# Patient Record
Sex: Male | Born: 1965 | Race: White | Hispanic: No | Marital: Married | Smoking: Never smoker
Health system: Southern US, Community
[De-identification: ages and names within clinical notes are randomized; demographics above are authoritative.]

## PROBLEM LIST (undated history)

## (undated) DIAGNOSIS — E079 Disorder of thyroid, unspecified: Secondary | ICD-10-CM

## (undated) HISTORY — DX: Disorder of thyroid, unspecified: E07.9

## (undated) HISTORY — PX: ACHILLES TENDON REPAIR: SUR1153

## (undated) HISTORY — PX: APPENDECTOMY: SHX54

## (undated) HISTORY — PX: KNEE SURGERY: SHX244

---

## 2003-12-05 ENCOUNTER — Observation Stay: Payer: Self-pay | Admitting: General Surgery

## 2015-10-13 DIAGNOSIS — W458XXA Other foreign body or object entering through skin, initial encounter: Secondary | ICD-10-CM | POA: Diagnosis not present

## 2015-10-13 DIAGNOSIS — S61244A Puncture wound with foreign body of right ring finger without damage to nail, initial encounter: Secondary | ICD-10-CM | POA: Diagnosis not present

## 2015-10-13 DIAGNOSIS — Y92832 Beach as the place of occurrence of the external cause: Secondary | ICD-10-CM | POA: Diagnosis not present

## 2015-10-13 DIAGNOSIS — Z23 Encounter for immunization: Secondary | ICD-10-CM | POA: Diagnosis not present

## 2015-10-29 DIAGNOSIS — M11262 Other chondrocalcinosis, left knee: Secondary | ICD-10-CM | POA: Diagnosis not present

## 2015-10-29 DIAGNOSIS — M1712 Unilateral primary osteoarthritis, left knee: Secondary | ICD-10-CM | POA: Diagnosis not present

## 2015-10-29 DIAGNOSIS — M25462 Effusion, left knee: Secondary | ICD-10-CM | POA: Diagnosis not present

## 2016-04-04 DIAGNOSIS — M1712 Unilateral primary osteoarthritis, left knee: Secondary | ICD-10-CM | POA: Diagnosis not present

## 2016-05-02 DIAGNOSIS — M9903 Segmental and somatic dysfunction of lumbar region: Secondary | ICD-10-CM | POA: Diagnosis not present

## 2016-05-02 DIAGNOSIS — M5431 Sciatica, right side: Secondary | ICD-10-CM | POA: Diagnosis not present

## 2016-05-02 DIAGNOSIS — M9905 Segmental and somatic dysfunction of pelvic region: Secondary | ICD-10-CM | POA: Diagnosis not present

## 2016-05-02 DIAGNOSIS — M5136 Other intervertebral disc degeneration, lumbar region: Secondary | ICD-10-CM | POA: Diagnosis not present

## 2016-05-03 DIAGNOSIS — M5136 Other intervertebral disc degeneration, lumbar region: Secondary | ICD-10-CM | POA: Diagnosis not present

## 2016-05-03 DIAGNOSIS — M5431 Sciatica, right side: Secondary | ICD-10-CM | POA: Diagnosis not present

## 2016-05-03 DIAGNOSIS — M9905 Segmental and somatic dysfunction of pelvic region: Secondary | ICD-10-CM | POA: Diagnosis not present

## 2016-05-03 DIAGNOSIS — M9903 Segmental and somatic dysfunction of lumbar region: Secondary | ICD-10-CM | POA: Diagnosis not present

## 2016-05-16 DIAGNOSIS — M5431 Sciatica, right side: Secondary | ICD-10-CM | POA: Diagnosis not present

## 2016-05-16 DIAGNOSIS — M5136 Other intervertebral disc degeneration, lumbar region: Secondary | ICD-10-CM | POA: Diagnosis not present

## 2016-05-16 DIAGNOSIS — M9903 Segmental and somatic dysfunction of lumbar region: Secondary | ICD-10-CM | POA: Diagnosis not present

## 2016-05-16 DIAGNOSIS — M9905 Segmental and somatic dysfunction of pelvic region: Secondary | ICD-10-CM | POA: Diagnosis not present

## 2016-05-18 DIAGNOSIS — M9905 Segmental and somatic dysfunction of pelvic region: Secondary | ICD-10-CM | POA: Diagnosis not present

## 2016-05-18 DIAGNOSIS — M5431 Sciatica, right side: Secondary | ICD-10-CM | POA: Diagnosis not present

## 2016-05-18 DIAGNOSIS — M9903 Segmental and somatic dysfunction of lumbar region: Secondary | ICD-10-CM | POA: Diagnosis not present

## 2016-05-18 DIAGNOSIS — M5136 Other intervertebral disc degeneration, lumbar region: Secondary | ICD-10-CM | POA: Diagnosis not present

## 2016-05-20 DIAGNOSIS — M9905 Segmental and somatic dysfunction of pelvic region: Secondary | ICD-10-CM | POA: Diagnosis not present

## 2016-05-20 DIAGNOSIS — M5431 Sciatica, right side: Secondary | ICD-10-CM | POA: Diagnosis not present

## 2016-05-20 DIAGNOSIS — M9903 Segmental and somatic dysfunction of lumbar region: Secondary | ICD-10-CM | POA: Diagnosis not present

## 2016-05-20 DIAGNOSIS — M5136 Other intervertebral disc degeneration, lumbar region: Secondary | ICD-10-CM | POA: Diagnosis not present

## 2016-05-23 DIAGNOSIS — M9903 Segmental and somatic dysfunction of lumbar region: Secondary | ICD-10-CM | POA: Diagnosis not present

## 2016-05-23 DIAGNOSIS — M5136 Other intervertebral disc degeneration, lumbar region: Secondary | ICD-10-CM | POA: Diagnosis not present

## 2016-05-23 DIAGNOSIS — M9905 Segmental and somatic dysfunction of pelvic region: Secondary | ICD-10-CM | POA: Diagnosis not present

## 2016-05-23 DIAGNOSIS — M5431 Sciatica, right side: Secondary | ICD-10-CM | POA: Diagnosis not present

## 2016-05-25 DIAGNOSIS — M5431 Sciatica, right side: Secondary | ICD-10-CM | POA: Diagnosis not present

## 2016-05-25 DIAGNOSIS — M5136 Other intervertebral disc degeneration, lumbar region: Secondary | ICD-10-CM | POA: Diagnosis not present

## 2016-05-25 DIAGNOSIS — M9903 Segmental and somatic dysfunction of lumbar region: Secondary | ICD-10-CM | POA: Diagnosis not present

## 2016-05-25 DIAGNOSIS — M9905 Segmental and somatic dysfunction of pelvic region: Secondary | ICD-10-CM | POA: Diagnosis not present

## 2016-05-30 DIAGNOSIS — M9903 Segmental and somatic dysfunction of lumbar region: Secondary | ICD-10-CM | POA: Diagnosis not present

## 2016-05-30 DIAGNOSIS — M9905 Segmental and somatic dysfunction of pelvic region: Secondary | ICD-10-CM | POA: Diagnosis not present

## 2016-05-30 DIAGNOSIS — M5431 Sciatica, right side: Secondary | ICD-10-CM | POA: Diagnosis not present

## 2016-05-30 DIAGNOSIS — M5136 Other intervertebral disc degeneration, lumbar region: Secondary | ICD-10-CM | POA: Diagnosis not present

## 2016-06-01 DIAGNOSIS — M9905 Segmental and somatic dysfunction of pelvic region: Secondary | ICD-10-CM | POA: Diagnosis not present

## 2016-06-01 DIAGNOSIS — M5136 Other intervertebral disc degeneration, lumbar region: Secondary | ICD-10-CM | POA: Diagnosis not present

## 2016-06-01 DIAGNOSIS — M9903 Segmental and somatic dysfunction of lumbar region: Secondary | ICD-10-CM | POA: Diagnosis not present

## 2016-06-01 DIAGNOSIS — M5431 Sciatica, right side: Secondary | ICD-10-CM | POA: Diagnosis not present

## 2016-07-18 DIAGNOSIS — M9905 Segmental and somatic dysfunction of pelvic region: Secondary | ICD-10-CM | POA: Diagnosis not present

## 2016-07-18 DIAGNOSIS — M9903 Segmental and somatic dysfunction of lumbar region: Secondary | ICD-10-CM | POA: Diagnosis not present

## 2016-07-18 DIAGNOSIS — M5431 Sciatica, right side: Secondary | ICD-10-CM | POA: Diagnosis not present

## 2016-07-18 DIAGNOSIS — M5136 Other intervertebral disc degeneration, lumbar region: Secondary | ICD-10-CM | POA: Diagnosis not present

## 2016-11-28 DIAGNOSIS — M1712 Unilateral primary osteoarthritis, left knee: Secondary | ICD-10-CM | POA: Diagnosis not present

## 2016-12-19 DIAGNOSIS — M25562 Pain in left knee: Secondary | ICD-10-CM | POA: Diagnosis not present

## 2016-12-19 DIAGNOSIS — M11262 Other chondrocalcinosis, left knee: Secondary | ICD-10-CM | POA: Diagnosis not present

## 2016-12-19 DIAGNOSIS — M170 Bilateral primary osteoarthritis of knee: Secondary | ICD-10-CM | POA: Diagnosis not present

## 2017-05-01 DIAGNOSIS — M1712 Unilateral primary osteoarthritis, left knee: Secondary | ICD-10-CM | POA: Diagnosis not present

## 2017-05-08 DIAGNOSIS — M1712 Unilateral primary osteoarthritis, left knee: Secondary | ICD-10-CM | POA: Diagnosis not present

## 2017-05-15 DIAGNOSIS — M1712 Unilateral primary osteoarthritis, left knee: Secondary | ICD-10-CM | POA: Diagnosis not present

## 2017-08-30 ENCOUNTER — Encounter: Payer: Self-pay | Admitting: Adult Health

## 2017-08-30 ENCOUNTER — Ambulatory Visit: Payer: BLUE CROSS/BLUE SHIELD | Admitting: Adult Health

## 2017-08-30 VITALS — BP 142/82 | HR 62 | Resp 16 | Ht 75.0 in | Wt 280.2 lb

## 2017-08-30 DIAGNOSIS — N529 Male erectile dysfunction, unspecified: Secondary | ICD-10-CM | POA: Diagnosis not present

## 2017-08-30 DIAGNOSIS — Z0000 Encounter for general adult medical examination without abnormal findings: Secondary | ICD-10-CM | POA: Diagnosis not present

## 2017-08-30 DIAGNOSIS — I10 Essential (primary) hypertension: Secondary | ICD-10-CM | POA: Diagnosis not present

## 2017-08-30 NOTE — Progress Notes (Signed)
[facility] [address] [place], [place] [address]  Internal MEDICINE  Office Visit Note  Patient Name: Howard Hahn  [id number]  [id number]  Date of Service: 09/04/2017  Chief Complaint  Patient presents with  . Establish Care    New patient needing paperwork filled out for insurance company     HPI Pt is here for routine health maintenance examination.  He denies chronic problems.  He has been watching is diet, and reports a 33lb weight loss over two months. He reports good health, and has no significant complaints.  He is concerned with some erectile dysfunction he is having.  He is having trouble maintaining erection during intercourse. He would like his testosterone level checked.   Current Medication: Outpatient Encounter Medications as of 08/30/2017  Medication Sig  . sildenafil (VIAGRA) 100 MG tablet Take 0.5-1 tablets (50-100 mg total) by mouth daily as needed for erectile dysfunction.   No facility-administered encounter medications on file as of 08/30/2017.     Surgical History: Past Surgical History:  Procedure Laterality Date  . ACHILLES TENDON REPAIR    . APPENDECTOMY    . KNEE SURGERY      Medical History: History reviewed. No pertinent past medical history.  Family History: Family History  Problem Relation Age of Onset  . Lung cancer Mother   . Prostate cancer Father    Review of Systems  Constitutional: Negative.  Negative for chills, fatigue and unexpected weight change.  HENT: Negative.  Negative for congestion.   Eyes: Negative for redness.  Respiratory: Negative.  Negative for chest tightness.   Cardiovascular: Negative.  Negative for palpitations.  Gastrointestinal: Negative.  Negative for abdominal pain, constipation, diarrhea, nausea and vomiting.  Endocrine: Negative.   Genitourinary: Negative.  Negative for dysuria and frequency.  Musculoskeletal: Negative.  Negative for arthralgias, back pain, joint swelling and neck  pain.  Skin: Negative.  Negative for rash.  Allergic/Immunologic: Negative.   Neurological: Negative.  Negative for tremors and numbness.  Hematological: Negative for adenopathy. Does not bruise/bleed easily.  Psychiatric/Behavioral: Negative.  Negative for behavioral problems, sleep disturbance and suicidal ideas. The patient is not nervous/anxious.    Vital Signs: BP (!) 142/82   Pulse 62   Resp 16   Ht 6\' 3"  (1.905 m)   Wt 280 lb 3.2 oz (127.1 kg)   SpO2 95%   BMI 35.02 kg/m    Physical Exam  Constitutional: He is oriented to person, place, and time. He appears well-developed and well-nourished. No distress.  HENT:  Head: Normocephalic and atraumatic.  Mouth/Throat: Oropharynx is clear and moist. No oropharyngeal exudate.  Eyes: Pupils are equal, round, and reactive to light. EOM are normal.  Neck: Normal range of motion. Neck supple. No JVD present. No tracheal deviation present. No thyromegaly present.  Cardiovascular: Normal rate, regular rhythm and normal heart sounds. Exam reveals no gallop and no friction rub.  No murmur heard. Pulmonary/Chest: Effort normal and breath sounds normal. No respiratory distress. He has no wheezes. He has no rales. He exhibits no tenderness.  Abdominal: Soft. There is no hepatosplenomegaly. There is no tenderness. There is no guarding. No hernia. Hernia confirmed negative in the right inguinal area and confirmed negative in the left inguinal area.  Genitourinary: Rectum normal, testes normal and penis normal. Rectal exam shows anal tone normal. Prostate is not enlarged and not tender. Cremasteric reflex is present. Right testis shows no mass, no swelling and no tenderness. Left testis shows no mass, no  swelling and no tenderness. Circumcised. No phimosis, hypospadias, penile erythema or penile tenderness. No discharge found.  Musculoskeletal: Normal range of motion.  Lymphadenopathy:    He has no cervical adenopathy. No inguinal adenopathy noted  on the right or left side.  Neurological: He is alert and oriented to person, place, and time. No cranial nerve deficit.  Skin: Skin is warm and dry. He is not diaphoretic.  Psychiatric: He has a normal mood and affect. His behavior is normal. Judgment and thought content normal.  Nursing note and vitals reviewed.  Assessment/Plan: 1. Routine general medical examination at a health care facility Yearly health screening to establish care.  - CBC with Differential/Platelet - Lipid Panel With LDL/HDL Ratio - TSH - T4, free - Comprehensive metabolic panel - Testosterone,Free and Total - PSA - Hemoglobin A1c  2. Erectile dysfunction, unspecified erectile dysfunction type Check Testosterone level.  - Testosterone,Free and Total  General Counseling: Howard LevinsJoseph verbalizes understanding of the findings of todays visit and agrees with plan of treatment. I have discussed any further diagnostic evaluation that may be needed or ordered today. We also reviewed his medications today. he has been encouraged to call the office with any questions or concerns that should arise related to todays visit.   Orders Placed This Encounter  Procedures  . CBC with Differential/Platelet  . Lipid Panel With LDL/HDL Ratio  . TSH  . T4, free  . Comprehensive metabolic panel  . Testosterone,Free and Total  . PSA  . Hemoglobin A1c  . T4  . T3  . Specimen status report    Meds ordered this encounter  Medications  . sildenafil (VIAGRA) 100 MG tablet    Sig: Take 0.5-1 tablets (50-100 mg total) by mouth daily as needed for erectile dysfunction.    Dispense:  5 tablet    Refill:  11    Time spent: 30 Minutes  This patient was seen by Blima LedgerAdam Dysen Edmondson AGNP-C in Collaboration with Dr Lyndon CodeFozia M Khan as a part of collaborative care agreement   Lyndon CodeFozia M Khan, MD  Internal Medicine

## 2017-08-30 NOTE — Progress Notes (Signed)
Pt presents with health establishment paperwork that needs to be filled out for [employer]. Patient was on vacation when they provided care at work.

## 2017-08-31 ENCOUNTER — Ambulatory Visit: Payer: BLUE CROSS/BLUE SHIELD

## 2017-08-31 ENCOUNTER — Telehealth: Payer: Self-pay | Admitting: Adult Health

## 2017-08-31 DIAGNOSIS — E1165 Type 2 diabetes mellitus with hyperglycemia: Secondary | ICD-10-CM

## 2017-08-31 DIAGNOSIS — Z0001 Encounter for general adult medical examination with abnormal findings: Secondary | ICD-10-CM

## 2017-08-31 LAB — POCT GLYCOSYLATED HEMOGLOBIN (HGB A1C): Hemoglobin A1C: 5.6 % (ref 4.0–5.6)

## 2017-08-31 LAB — PSA: Prostate Specific Ag, Serum: 1.7 ng/mL (ref 0.0–4.0)

## 2017-08-31 NOTE — Telephone Encounter (Signed)
Spoke to [place] at [facility] added T3/T4 to test

## 2017-09-01 LAB — LIPID PANEL WITH LDL/HDL RATIO
CHOLESTEROL TOTAL: 179 mg/dL (ref 100–199)
HDL: 43 mg/dL (ref >39)
LDL Calculated: 116 mg/dL — ABNORMAL HIGH (ref 0–99)
LDl/HDL Ratio: 2.7 ratio (ref 0.0–3.6)
Triglycerides: 100 mg/dL (ref 0–149)
VLDL CHOLESTEROL CAL: 20 mg/dL (ref 5–40)

## 2017-09-01 LAB — CBC WITH DIFFERENTIAL/PLATELET
BASOS ABS: 0.1 10*3/uL (ref 0.0–0.2)
BASOS: 1 %
EOS (ABSOLUTE): 0.2 10*3/uL (ref 0.0–0.4)
Eos: 4 %
Hematocrit: 44.6 % (ref 37.5–51.0)
Hemoglobin: 14.7 g/dL (ref 13.0–17.7)
IMMATURE GRANS (ABS): 0 10*3/uL (ref 0.0–0.1)
IMMATURE GRANULOCYTES: 0 %
LYMPHS: 19 %
Lymphocytes Absolute: 1.3 10*3/uL (ref 0.7–3.1)
MCH: 29.4 pg (ref 26.6–33.0)
MCHC: 33 g/dL (ref 31.5–35.7)
MCV: 89 fL (ref 79–97)
Monocytes Absolute: 0.6 10*3/uL (ref 0.1–0.9)
Monocytes: 9 %
NEUTROS PCT: 67 %
Neutrophils Absolute: 4.5 10*3/uL (ref 1.4–7.0)
PLATELETS: 258 10*3/uL (ref 150–450)
RBC: 5 x10E6/uL (ref 4.14–5.80)
RDW: 12.9 % (ref 12.3–15.4)
WBC: 6.7 10*3/uL (ref 3.4–10.8)

## 2017-09-01 LAB — COMPREHENSIVE METABOLIC PANEL
ALBUMIN: 4.4 g/dL (ref 3.5–5.5)
ALT: 31 IU/L (ref 0–44)
AST: 24 IU/L (ref 0–40)
Albumin/Globulin Ratio: 1.8 (ref 1.2–2.2)
Alkaline Phosphatase: 86 IU/L (ref 39–117)
BUN/Creatinine Ratio: 15 (ref 9–20)
BUN: 12 mg/dL (ref 6–24)
Bilirubin Total: 0.3 mg/dL (ref 0.0–1.2)
CALCIUM: 9.2 mg/dL (ref 8.7–10.2)
CO2: 21 mmol/L (ref 20–29)
Chloride: 104 mmol/L (ref 96–106)
Creatinine, Ser: 0.78 mg/dL (ref 0.76–1.27)
GFR, EST AFRICAN AMERICAN: 120 mL/min/{1.73_m2} (ref >59)
GFR, EST NON AFRICAN AMERICAN: 104 mL/min/{1.73_m2} (ref >59)
GLOBULIN, TOTAL: 2.4 g/dL (ref 1.5–4.5)
Glucose: 91 mg/dL (ref 65–99)
Potassium: 4.3 mmol/L (ref 3.5–5.2)
SODIUM: 141 mmol/L (ref 134–144)
TOTAL PROTEIN: 6.8 g/dL (ref 6.0–8.5)

## 2017-09-01 LAB — TESTOSTERONE,FREE AND TOTAL
TESTOSTERONE FREE: 8.7 pg/mL (ref 7.2–24.0)
TESTOSTERONE: 394 ng/dL (ref 264–916)

## 2017-09-01 LAB — SPECIMEN STATUS REPORT

## 2017-09-01 LAB — TSH: TSH: 5.71 u[IU]/mL — ABNORMAL HIGH (ref 0.450–4.500)

## 2017-09-01 LAB — T4, FREE: Free T4: 1.03 ng/dL (ref 0.82–1.77)

## 2017-09-01 LAB — T3: T3, Total: 111 ng/dL (ref 71–180)

## 2017-09-01 LAB — T4: T4 TOTAL: 6.4 ug/dL (ref 4.5–12.0)

## 2017-09-04 MED ORDER — LEVOTHYROXINE SODIUM 50 MCG PO TABS
ORAL_TABLET | ORAL | 1 refills | Status: DC
Start: 2017-09-04 — End: 2017-10-30

## 2017-09-04 MED ORDER — SILDENAFIL CITRATE 100 MG PO TABS: 50.0000 mg | ORAL_TABLET | Freq: Every day | ORAL | 11 refills | Status: DC | PRN

## 2017-09-05 ENCOUNTER — Other Ambulatory Visit: Payer: Self-pay | Admitting: Internal Medicine

## 2017-09-05 DIAGNOSIS — E039 Hypothyroidism, unspecified: Secondary | ICD-10-CM

## 2017-09-14 ENCOUNTER — Telehealth: Payer: Self-pay | Admitting: Adult Health

## 2017-10-30 ENCOUNTER — Other Ambulatory Visit: Payer: Self-pay

## 2017-10-30 MED ORDER — LEVOTHYROXINE SODIUM 50 MCG PO TABS: ORAL_TABLET | ORAL | 1 refills | Status: DC

## 2017-11-03 ENCOUNTER — Ambulatory Visit: Payer: Self-pay | Admitting: Adult Health

## 2017-11-03 ENCOUNTER — Other Ambulatory Visit: Payer: Self-pay

## 2017-11-15 ENCOUNTER — Ambulatory Visit: Payer: Self-pay | Admitting: Adult Health

## 2017-11-20 ENCOUNTER — Other Ambulatory Visit: Payer: Self-pay | Admitting: Adult Health

## 2017-11-20 DIAGNOSIS — E039 Hypothyroidism, unspecified: Secondary | ICD-10-CM | POA: Diagnosis not present

## 2017-11-21 LAB — THYROGLOBULIN ANTIBODY

## 2017-11-21 LAB — T4, FREE: FREE T4: 1.13 ng/dL (ref 0.82–1.77)

## 2017-11-21 LAB — TSH: TSH: 3.58 u[IU]/mL (ref 0.450–4.500)

## 2017-11-21 LAB — THYROID PEROXIDASE ANTIBODY: Thyroperoxidase Ab SerPl-aCnc: 10 IU/mL (ref 0–34)

## 2017-12-06 ENCOUNTER — Ambulatory Visit: Payer: BLUE CROSS/BLUE SHIELD | Admitting: Adult Health

## 2017-12-06 ENCOUNTER — Encounter: Payer: Self-pay | Admitting: Adult Health

## 2017-12-06 VITALS — BP 118/72 | HR 64 | Resp 16 | Ht 75.0 in | Wt 267.0 lb

## 2017-12-06 DIAGNOSIS — E039 Hypothyroidism, unspecified: Secondary | ICD-10-CM | POA: Diagnosis not present

## 2017-12-06 DIAGNOSIS — I10 Essential (primary) hypertension: Secondary | ICD-10-CM

## 2017-12-06 MED ORDER — LEVOTHYROXINE SODIUM 50 MCG PO TABS: ORAL_TABLET | ORAL | 2 refills | Status: DC

## 2017-12-06 NOTE — Progress Notes (Signed)
[facility] [address] [place], [place] [address]  Internal MEDICINE  Office Visit Note  Patient Name: Howard Hahn  [id number]  [id number]  Date of Service: 12/06/2017  Chief Complaint  Patient presents with  . Hypothyroidism    3 month follow up labs     HPI  Patient here for 39-month follow-up after being started on Synthroid 50 mcg for hypothyroid found to physical labs.  His thyroid levels are much improved after these 2 months.  He reports feeling great.  He reports he has lost a total of 40 pounds since he started diet and exercise.  He is lost 13 pounds since I first saw him in July of this year.  He and his wife both have started a clean diet program.  They do have concern that they feel like they can see his thyroid now that he has lost weight in his neck.  Other than this they deny complaints.   Current Medication: Outpatient Encounter Medications as of 12/06/2017  Medication Sig  . levothyroxine (SYNTHROID, LEVOTHROID) 50 MCG tablet Take one tab in am on empty stomach, wait 30 min before eating  . sildenafil (VIAGRA) 100 MG tablet Take 0.5-1 tablets (50-100 mg total) by mouth daily as needed for erectile dysfunction.  . [DISCONTINUED] levothyroxine (SYNTHROID, LEVOTHROID) 50 MCG tablet Take one tab in am on empty stomach, wait 30 min before eating   No facility-administered encounter medications on file as of 12/06/2017.     Surgical History: Past Surgical History:  Procedure Laterality Date  . ACHILLES TENDON REPAIR    . APPENDECTOMY    . KNEE SURGERY      Medical History: Past Medical History:  Diagnosis Date  . Thyroid disease     Family History: Family History  Problem Relation Age of Onset  . Lung cancer Mother   . Prostate cancer Father     Social History   Socioeconomic History  . Marital status: Married    Spouse name: Not on file  . Number of children: Not on file  . Years of education: Not on file  . Highest education  level: Not on file  Occupational History  . Not on file  Social Needs  . Financial resource strain: Not on file  . Food insecurity:    Worry: Not on file    Inability: Not on file  . Transportation needs:    Medical: Not on file    Non-medical: Not on file  Tobacco Use  . Smoking status: Never Smoker  . Smokeless tobacco: Never Used  Substance and Sexual Activity  . Alcohol use: Never    Frequency: Never  . Drug use: Never  . Sexual activity: Not on file  Lifestyle  . Physical activity:    Days per week: Not on file    Minutes per session: Not on file  . Stress: Not on file  Relationships  . Social connections:    Talks on phone: Not on file    Gets together: Not on file    Attends religious service: Not on file    Active member of club or organization: Not on file    Attends meetings of clubs or organizations: Not on file    Relationship status: Not on file  . Intimate partner violence:    Fear of current or ex partner: Not on file    Emotionally abused: Not on file    Physically abused: Not on file    Forced  sexual activity: Not on file  Other Topics Concern  . Not on file  Social History Narrative  . Not on file      Review of Systems  Constitutional: Negative.  Negative for chills, fatigue and unexpected weight change.  HENT: Negative.  Negative for congestion, rhinorrhea, sneezing and sore throat.   Eyes: Negative for redness.  Respiratory: Negative.  Negative for cough, chest tightness and shortness of breath.   Cardiovascular: Negative.  Negative for chest pain and palpitations.  Gastrointestinal: Negative.  Negative for abdominal pain, constipation, diarrhea, nausea and vomiting.  Endocrine: Negative.   Genitourinary: Negative.  Negative for dysuria and frequency.  Musculoskeletal: Negative.  Negative for arthralgias, back pain, joint swelling and neck pain.  Skin: Negative.  Negative for rash.  Allergic/Immunologic: Negative.   Neurological: Negative.   Negative for tremors and numbness.  Hematological: Negative for adenopathy. Does not bruise/bleed easily.  Psychiatric/Behavioral: Negative.  Negative for behavioral problems, sleep disturbance and suicidal ideas. The patient is not nervous/anxious.     Vital Signs: BP 118/72   Pulse 64   Resp 16   Ht 6\' 3"  (1.905 m)   Wt 267 lb (121.1 kg)   SpO2 98%   BMI 33.37 kg/m    Physical Exam  Constitutional: He is oriented to person, place, and time. He appears well-developed and well-nourished. No distress.  HENT:  Head: Normocephalic and atraumatic.  Mouth/Throat: Oropharynx is clear and moist. No oropharyngeal exudate.  Eyes: Pupils are equal, round, and reactive to light. EOM are normal.  Neck: Normal range of motion. Neck supple. No JVD present. No tracheal deviation present. Thyromegaly present.  Pt has prominent Adams apple that is more visible now with weight loss.  However, slight enlargement of thyroid area is noted.    Cardiovascular: Normal rate, regular rhythm and normal heart sounds. Exam reveals no gallop and no friction rub.  No murmur heard. Pulmonary/Chest: Effort normal and breath sounds normal. No respiratory distress. He has no wheezes. He has no rales. He exhibits no tenderness.  Abdominal: Soft. There is no tenderness. There is no guarding.  Musculoskeletal: Normal range of motion.  Lymphadenopathy:    He has no cervical adenopathy.  Neurological: He is alert and oriented to person, place, and time. No cranial nerve deficit.  Skin: Skin is warm and dry. He is not diaphoretic.  Psychiatric: He has a normal mood and affect. His behavior is normal. Judgment and thought content normal.  Nursing note and vitals reviewed.  Assessment/Plan: 1. Hypothyroidism, unspecified type Due to this possible enlargement of thyroid, thyroid ultrasound ordered.  We will follow-up with patient when results are available. - US THYROID; Future  2. Elevated blood pressure reading in  office with diagnosis of hypertension In the past patient's blood pressure was elevated during visit.  He is well controlled today most likely due to thyroid control and continued weight loss.  He denies any events of chest pain, headache, palpitations or other symptoms of high blood pressure.  General Counseling: forrester blando understanding of the findings of todays visit and agrees with plan of treatment. I have discussed any further diagnostic evaluation that may be needed or ordered today. We also reviewed his medications today. he has been encouraged to call the office with any questions or concerns that should arise related to todays visit.    Orders Placed This Encounter  Procedures  . US THYROID    Meds ordered this encounter  Medications  . levothyroxine (SYNTHROID, LEVOTHROID) 50  MCG tablet    Sig: Take one tab in am on empty stomach, wait 30 min before eating    Dispense:  90 tablet    Refill:  2    Time spent: 25 Minutes   This patient was seen by Blima Ledger AGNP-C in Collaboration with Dr Lyndon Code as a part of collaborative care agreement     Johnna Acosta AGNP-C Internal medicine

## 2017-12-06 NOTE — Patient Instructions (Signed)
Hypothyroidism Hypothyroidism is a disorder of the thyroid. The thyroid is a large gland that is located in the lower front of the neck. The thyroid releases hormones that control how the body works. With hypothyroidism, the thyroid does not make enough of these hormones. What are the causes? Causes of hypothyroidism may include:  Viral infections.  Pregnancy.  Your own defense system (immune system) attacking your thyroid.  Certain medicines.  Birth defects.  Past radiation treatments to your head or neck.  Past treatment with radioactive iodine.  Past surgical removal of part or all of your thyroid.  Problems with the gland that is located in the center of your brain (pituitary).  What are the signs or symptoms? Signs and symptoms of hypothyroidism may include:  Feeling as though you have no energy (lethargy).  Inability to tolerate cold.  Weight gain that is not explained by a change in diet or exercise habits.  Dry skin.  Coarse hair.  Menstrual irregularity.  Slowing of thought processes.  Constipation.  Sadness or depression.  How is this diagnosed? Your health care provider may diagnose hypothyroidism with blood tests and ultrasound tests. How is this treated? Hypothyroidism is treated with medicine that replaces the hormones that your body does not make. After you begin treatment, it may take several weeks for symptoms to go away. Follow these instructions at home:  Take medicines only as directed by your health care provider.  If you start taking any new medicines, tell your health care provider.  Keep all follow-up visits as directed by your health care provider. This is important. As your condition improves, your dosage needs may change. You will need to have blood tests regularly so that your health care provider can watch your condition. Contact a health care provider if:  Your symptoms do not get better with treatment.  You are taking thyroid  replacement medicine and: ? You sweat excessively. ? You have tremors. ? You feel anxious. ? You lose weight rapidly. ? You cannot tolerate heat. ? You have emotional swings. ? You have diarrhea. ? You feel weak. Get help right away if:  You develop chest pain.  You develop an irregular heartbeat.  You develop a rapid heartbeat. This information is not intended to replace advice given to you by your health care provider. Make sure you discuss any questions you have with your health care provider. Document Released: 01/31/2005 Document Revised: 07/09/2015 Document Reviewed: 06/18/2013 Elsevier Interactive Patient Education  2018 Elsevier Inc.  

## 2017-12-13 ENCOUNTER — Telehealth: Payer: Self-pay | Admitting: Adult Health

## 2017-12-13 ENCOUNTER — Ambulatory Visit
Admission: RE | Admit: 2017-12-13 | Discharge: 2017-12-13 | Disposition: A | Discharge status: Home / Self Care | Payer: BLUE CROSS/BLUE SHIELD | Source: Ambulatory Visit | Attending: Adult Health | Admitting: Adult Health

## 2017-12-13 DIAGNOSIS — E039 Hypothyroidism, unspecified: Secondary | ICD-10-CM | POA: Insufficient documentation

## 2017-12-13 NOTE — Telephone Encounter (Signed)
Patient advised on thyroid US was normal

## 2018-01-17 ENCOUNTER — Ambulatory Visit: Payer: Self-pay | Admitting: Adult Health

## 2018-03-14 DIAGNOSIS — R0683 Snoring: Secondary | ICD-10-CM | POA: Diagnosis not present

## 2018-03-14 DIAGNOSIS — H93299 Other abnormal auditory perceptions, unspecified ear: Secondary | ICD-10-CM | POA: Diagnosis not present

## 2018-03-14 DIAGNOSIS — H93293 Other abnormal auditory perceptions, bilateral: Secondary | ICD-10-CM | POA: Diagnosis not present

## 2018-03-28 DIAGNOSIS — L249 Irritant contact dermatitis, unspecified cause: Secondary | ICD-10-CM | POA: Diagnosis not present

## 2018-03-28 DIAGNOSIS — L570 Actinic keratosis: Secondary | ICD-10-CM | POA: Diagnosis not present

## 2018-03-28 DIAGNOSIS — L218 Other seborrheic dermatitis: Secondary | ICD-10-CM | POA: Diagnosis not present

## 2018-03-28 DIAGNOSIS — Z1283 Encounter for screening for malignant neoplasm of skin: Secondary | ICD-10-CM | POA: Diagnosis not present

## 2018-04-27 ENCOUNTER — Telehealth: Payer: Self-pay

## 2018-04-27 ENCOUNTER — Other Ambulatory Visit: Payer: Self-pay

## 2018-04-27 ENCOUNTER — Other Ambulatory Visit: Payer: Self-pay | Admitting: Adult Health

## 2018-04-27 DIAGNOSIS — R7989 Other specified abnormal findings of blood chemistry: Secondary | ICD-10-CM

## 2018-04-27 DIAGNOSIS — E039 Hypothyroidism, unspecified: Secondary | ICD-10-CM

## 2018-04-27 NOTE — Telephone Encounter (Signed)
Pt advised put order need appt to review labs

## 2018-04-27 NOTE — Telephone Encounter (Signed)
Called patient and left message asked him to call back and schedule follow up after labs are drawn. Beth

## 2018-04-27 NOTE — Progress Notes (Signed)
Ordered patients TSH and Free T4 level. Will have him schedule an appt and return.

## 2018-04-27 NOTE — Telephone Encounter (Signed)
Howard Hahn, I ordered Free T4 and TSH,  Beth, please call patient and schedule and appt.  And please tell him to get his labs done at least two days before that appt.  Thank you.

## 2018-06-25 ENCOUNTER — Other Ambulatory Visit: Payer: Self-pay | Admitting: Adult Health

## 2018-06-25 ENCOUNTER — Other Ambulatory Visit: Payer: Self-pay

## 2018-06-25 MED ORDER — SCOPOLAMINE 1 MG/3DAYS TD PT72: 1.0000 | MEDICATED_PATCH | TRANSDERMAL | 1 refills | Status: AC | PRN

## 2018-06-25 NOTE — Progress Notes (Signed)
Scopolamine patches sent to [facility] for patients upcoming [place] trip.

## 2018-10-10 DIAGNOSIS — M5431 Sciatica, right side: Secondary | ICD-10-CM | POA: Diagnosis not present

## 2018-10-10 DIAGNOSIS — M5136 Other intervertebral disc degeneration, lumbar region: Secondary | ICD-10-CM | POA: Diagnosis not present

## 2018-10-10 DIAGNOSIS — M9903 Segmental and somatic dysfunction of lumbar region: Secondary | ICD-10-CM | POA: Diagnosis not present

## 2018-10-10 DIAGNOSIS — M9905 Segmental and somatic dysfunction of pelvic region: Secondary | ICD-10-CM | POA: Diagnosis not present

## 2018-10-17 DIAGNOSIS — M9903 Segmental and somatic dysfunction of lumbar region: Secondary | ICD-10-CM | POA: Diagnosis not present

## 2018-10-17 DIAGNOSIS — M9905 Segmental and somatic dysfunction of pelvic region: Secondary | ICD-10-CM | POA: Diagnosis not present

## 2018-10-17 DIAGNOSIS — M5431 Sciatica, right side: Secondary | ICD-10-CM | POA: Diagnosis not present

## 2018-10-17 DIAGNOSIS — M5136 Other intervertebral disc degeneration, lumbar region: Secondary | ICD-10-CM | POA: Diagnosis not present

## 2018-10-31 DIAGNOSIS — M5431 Sciatica, right side: Secondary | ICD-10-CM | POA: Diagnosis not present

## 2018-10-31 DIAGNOSIS — M5136 Other intervertebral disc degeneration, lumbar region: Secondary | ICD-10-CM | POA: Diagnosis not present

## 2018-10-31 DIAGNOSIS — M9903 Segmental and somatic dysfunction of lumbar region: Secondary | ICD-10-CM | POA: Diagnosis not present

## 2018-10-31 DIAGNOSIS — M9905 Segmental and somatic dysfunction of pelvic region: Secondary | ICD-10-CM | POA: Diagnosis not present

## 2018-11-07 DIAGNOSIS — M5431 Sciatica, right side: Secondary | ICD-10-CM | POA: Diagnosis not present

## 2018-11-07 DIAGNOSIS — M5136 Other intervertebral disc degeneration, lumbar region: Secondary | ICD-10-CM | POA: Diagnosis not present

## 2018-11-07 DIAGNOSIS — M9903 Segmental and somatic dysfunction of lumbar region: Secondary | ICD-10-CM | POA: Diagnosis not present

## 2018-11-07 DIAGNOSIS — M9905 Segmental and somatic dysfunction of pelvic region: Secondary | ICD-10-CM | POA: Diagnosis not present

## 2018-11-11 NOTE — Telephone Encounter (Signed)
NA

## 2018-11-28 DIAGNOSIS — M9905 Segmental and somatic dysfunction of pelvic region: Secondary | ICD-10-CM | POA: Diagnosis not present

## 2018-11-28 DIAGNOSIS — M9903 Segmental and somatic dysfunction of lumbar region: Secondary | ICD-10-CM | POA: Diagnosis not present

## 2018-11-28 DIAGNOSIS — M5431 Sciatica, right side: Secondary | ICD-10-CM | POA: Diagnosis not present

## 2018-11-28 DIAGNOSIS — M5136 Other intervertebral disc degeneration, lumbar region: Secondary | ICD-10-CM | POA: Diagnosis not present

## 2018-12-05 ENCOUNTER — Ambulatory Visit: Payer: BC Managed Care – PPO | Admitting: Adult Health

## 2018-12-05 ENCOUNTER — Encounter: Payer: Self-pay | Admitting: Adult Health

## 2018-12-05 ENCOUNTER — Other Ambulatory Visit: Payer: Self-pay

## 2018-12-05 VITALS — BP 147/89 | HR 75 | Temp 97.5°F | Resp 16 | Ht 75.0 in | Wt 287.0 lb

## 2018-12-05 DIAGNOSIS — E039 Hypothyroidism, unspecified: Secondary | ICD-10-CM | POA: Diagnosis not present

## 2018-12-05 DIAGNOSIS — M1812 Unilateral primary osteoarthritis of first carpometacarpal joint, left hand: Secondary | ICD-10-CM

## 2018-12-05 MED ORDER — MELOXICAM 15 MG PO TABS: 15.0000 mg | ORAL_TABLET | Freq: Every day | ORAL | 1 refills | Status: DC | PRN

## 2018-12-05 NOTE — Progress Notes (Signed)
[facility] [address] [place], [place] [address]  Internal MEDICINE  Office Visit Note  Patient Name: Howard Hahn  [id number]  [id number]  Date of Service: 12/05/2018  Chief Complaint  Patient presents with  . Hypothyroidism    HPI Patient is here for thyroid follow-up. Orders were sent for TSH and free T4 in March, patient unable to get labs drawn due to COVID. Reports no issues with fatigue, weight gain or mood swings. Has been off levothyroxine for several months. Reports wanting to see lab values before restarting medication. Complains of pain and swelling on left thumb due to osteoarthritis. Has been taking over the counter ibuprofen with relief but wakes up in mornings with pain and stiffness due to swelling.    Current Medication: Outpatient Encounter Medications as of 12/05/2018  Medication Sig  . levothyroxine (SYNTHROID, LEVOTHROID) 50 MCG tablet Take one tab in am on empty stomach, wait 30 min before eating (Patient not taking: Reported on 12/05/2018)  . sildenafil (VIAGRA) 100 MG tablet Take 0.5-1 tablets (50-100 mg total) by mouth daily as needed for erectile dysfunction. (Patient not taking: Reported on 12/05/2018)   No facility-administered encounter medications on file as of 12/05/2018.     Surgical History: Past Surgical History:  Procedure Laterality Date  . ACHILLES TENDON REPAIR    . APPENDECTOMY    . KNEE SURGERY      Medical History: Past Medical History:  Diagnosis Date  . Thyroid disease     Family History: Family History  Problem Relation Age of Onset  . Lung cancer Mother   . Prostate cancer Father     Social History   Socioeconomic History  . Marital status: Married    Spouse name: Not on file  . Number of children: Not on file  . Years of education: Not on file  . Highest education level: Not on file  Occupational History  . Not on file  Social Needs  . Financial resource strain: Not on file  . Food  insecurity    Worry: Not on file    Inability: Not on file  . Transportation needs    Medical: Not on file    Non-medical: Not on file  Tobacco Use  . Smoking status: Never Smoker  . Smokeless tobacco: Never Used  Substance and Sexual Activity  . Alcohol use: Never    Frequency: Never  . Drug use: Never  . Sexual activity: Not on file  Lifestyle  . Physical activity    Days per week: Not on file    Minutes per session: Not on file  . Stress: Not on file  Relationships  . Social Musician on phone: Not on file    Gets together: Not on file    Attends religious service: Not on file    Active member of club or organization: Not on file    Attends meetings of clubs or organizations: Not on file    Relationship status: Not on file  . Intimate partner violence    Fear of current or ex partner: Not on file    Emotionally abused: Not on file    Physically abused: Not on file    Forced sexual activity: Not on file  Other Topics Concern  . Not on file  Social History Narrative  . Not on file      Review of Systems  Constitutional: Negative.  Negative for chills, fatigue and unexpected weight change.  HENT:  Negative.  Negative for congestion, rhinorrhea, sneezing and sore throat.   Eyes: Negative for redness.  Respiratory: Negative.  Negative for cough, chest tightness and shortness of breath.   Cardiovascular: Negative.  Negative for chest pain and palpitations.  Gastrointestinal: Negative.  Negative for abdominal pain, constipation, diarrhea, nausea and vomiting.  Endocrine: Negative.   Genitourinary: Negative.  Negative for dysuria and frequency.  Musculoskeletal: Negative.  Negative for arthralgias, back pain, joint swelling and neck pain.  Skin: Negative.  Negative for rash.  Allergic/Immunologic: Negative.   Neurological: Negative.  Negative for tremors and numbness.  Hematological: Negative for adenopathy. Does not bruise/bleed easily.   Psychiatric/Behavioral: Negative.  Negative for behavioral problems, sleep disturbance and suicidal ideas. The patient is not nervous/anxious.       Vital Signs: BP (!) 147/89   Pulse 75   Temp (!) 97.5 F (36.4 C)   Resp 16   Ht 6\' 3"  (1.905 m)   Wt 287 lb (130.2 kg)   SpO2 95%   BMI 35.87 kg/m    Physical Exam Vitals signs and nursing note reviewed.  Constitutional:      General: He is not in acute distress.    Appearance: He is well-developed. He is not diaphoretic.  HENT:     Head: Normocephalic and atraumatic.     Mouth/Throat:     Pharynx: No oropharyngeal exudate.  Eyes:     Pupils: Pupils are equal, round, and reactive to light.  Neck:     Musculoskeletal: Normal range of motion and neck supple.     Thyroid: No thyromegaly.     Vascular: No JVD.     Trachea: No tracheal deviation.  Cardiovascular:     Rate and Rhythm: Normal rate and regular rhythm.     Heart sounds: Normal heart sounds. No murmur. No friction rub. No gallop.   Pulmonary:     Effort: Pulmonary effort is normal. No respiratory distress.     Breath sounds: Normal breath sounds. No wheezing or rales.  Chest:     Chest wall: No tenderness.  Abdominal:     Palpations: Abdomen is soft.     Tenderness: There is no abdominal tenderness. There is no guarding.  Musculoskeletal: Normal range of motion.  Lymphadenopathy:     Cervical: No cervical adenopathy.  Skin:    General: Skin is warm and dry.  Neurological:     Mental Status: He is alert and oriented to person, place, and time.     Cranial Nerves: No cranial nerve deficit.  Psychiatric:        Behavior: Behavior normal.        Thought Content: Thought content normal.        Judgment: Judgment normal.    Assessment/Plan: 1. Hypothyroidism, unspecified type Patient to get labs drawn that were ordered in March. Will follow-up with lab values with patient and determine plan of care based on them.  2. 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 Also instructed to try placing ice on affected area for relief of symptoms. - meloxicam (MOBIC) 15 MG tablet; Take 1 tablet (15 mg total) by mouth daily as needed for pain.  Dispense: 30 tablet; Refill: 1   General Counseling: Elizbeth Squires understanding of the findings of todays visit and agrees with plan of treatment. I have discussed any further diagnostic evaluation that may  be needed or ordered today. We also reviewed his medications today. he has been encouraged to call the office with any questions or concerns that should arise related to todays visit.    No orders of the defined types were placed in this encounter.   No orders of the defined types were placed in this encounter.   Time spent: 25 Minutes   This patient was seen by Blima LedgerAdam Oz Gammel AGNP-C in Collaboration with Dr Lyndon CodeFozia M Khan as a part of collaborative care agreement     Johnna AcostaAdam J. Sylvain Hasten AGNP-C Internal medicine

## 2018-12-26 DIAGNOSIS — M9905 Segmental and somatic dysfunction of pelvic region: Secondary | ICD-10-CM | POA: Diagnosis not present

## 2018-12-26 DIAGNOSIS — M5431 Sciatica, right side: Secondary | ICD-10-CM | POA: Diagnosis not present

## 2018-12-26 DIAGNOSIS — M5136 Other intervertebral disc degeneration, lumbar region: Secondary | ICD-10-CM | POA: Diagnosis not present

## 2018-12-26 DIAGNOSIS — M9903 Segmental and somatic dysfunction of lumbar region: Secondary | ICD-10-CM | POA: Diagnosis not present

## 2019-01-23 ENCOUNTER — Other Ambulatory Visit: Payer: Self-pay

## 2019-01-23 DIAGNOSIS — M9905 Segmental and somatic dysfunction of pelvic region: Secondary | ICD-10-CM | POA: Diagnosis not present

## 2019-01-23 DIAGNOSIS — R5381 Other malaise: Secondary | ICD-10-CM

## 2019-01-23 DIAGNOSIS — M5431 Sciatica, right side: Secondary | ICD-10-CM | POA: Diagnosis not present

## 2019-01-23 DIAGNOSIS — M5136 Other intervertebral disc degeneration, lumbar region: Secondary | ICD-10-CM | POA: Diagnosis not present

## 2019-01-23 DIAGNOSIS — E079 Disorder of thyroid, unspecified: Secondary | ICD-10-CM

## 2019-01-23 DIAGNOSIS — M9903 Segmental and somatic dysfunction of lumbar region: Secondary | ICD-10-CM | POA: Diagnosis not present

## 2019-01-23 DIAGNOSIS — R5382 Chronic fatigue, unspecified: Secondary | ICD-10-CM

## 2019-01-23 DIAGNOSIS — R5383 Other fatigue: Secondary | ICD-10-CM

## 2019-01-23 DIAGNOSIS — E756 Lipid storage disorder, unspecified: Secondary | ICD-10-CM

## 2019-01-28 DIAGNOSIS — R5382 Chronic fatigue, unspecified: Secondary | ICD-10-CM | POA: Diagnosis not present

## 2019-01-28 DIAGNOSIS — R5381 Other malaise: Secondary | ICD-10-CM | POA: Diagnosis not present

## 2019-01-28 DIAGNOSIS — R5383 Other fatigue: Secondary | ICD-10-CM | POA: Diagnosis not present

## 2019-01-28 DIAGNOSIS — E756 Lipid storage disorder, unspecified: Secondary | ICD-10-CM | POA: Diagnosis not present

## 2019-01-28 DIAGNOSIS — E079 Disorder of thyroid, unspecified: Secondary | ICD-10-CM | POA: Diagnosis not present

## 2019-01-28 LAB — CBC WITH DIFFERENTIAL/PLATELET
Eos: 3 %
Hemoglobin: 15.3 g/dL (ref 13.0–17.7)
Immature Grans (Abs): 0 10*3/uL (ref 0.0–0.1)
MCH: 29.9 pg (ref 26.6–33.0)
MCHC: 34.5 g/dL (ref 31.5–35.7)
MCV: 87 fL (ref 79–97)
Monocytes Absolute: 0.6 10*3/uL (ref 0.1–0.9)
Monocytes: 8 %
Neutrophils: 68 %

## 2019-01-28 LAB — COMPREHENSIVE METABOLIC PANEL

## 2019-01-28 LAB — LIPID PANEL WITH LDL/HDL RATIO

## 2019-01-29 LAB — CBC WITH DIFFERENTIAL/PLATELET
Basophils Absolute: 0.1 10*3/uL (ref 0.0–0.2)
Basos: 1 %
EOS (ABSOLUTE): 0.2 10*3/uL (ref 0.0–0.4)
Hematocrit: 44.3 % (ref 37.5–51.0)
Immature Granulocytes: 0 %
Lymphocytes Absolute: 1.4 10*3/uL (ref 0.7–3.1)
Lymphs: 20 %
Neutrophils Absolute: 5 10*3/uL (ref 1.4–7.0)
Platelets: 251 10*3/uL (ref 150–450)
RBC: 5.11 x10E6/uL (ref 4.14–5.80)
RDW: 12.5 % (ref 11.6–15.4)
WBC: 7.3 10*3/uL (ref 3.4–10.8)

## 2019-01-29 LAB — TSH+FREE T4
Free T4: 1.05 ng/dL (ref 0.82–1.77)
TSH: 5.79 u[IU]/mL — ABNORMAL HIGH (ref 0.450–4.500)

## 2019-01-29 LAB — COMPREHENSIVE METABOLIC PANEL
ALT: 36 IU/L (ref 0–44)
Albumin/Globulin Ratio: 1.7 (ref 1.2–2.2)
Albumin: 4.4 g/dL (ref 3.8–4.9)
Alkaline Phosphatase: 98 IU/L (ref 39–117)
BUN/Creatinine Ratio: 13 (ref 9–20)
BUN: 12 mg/dL (ref 6–24)
Bilirubin Total: 0.3 mg/dL (ref 0.0–1.2)
CO2: 21 mmol/L (ref 20–29)
Calcium: 9.5 mg/dL (ref 8.7–10.2)
Chloride: 103 mmol/L (ref 96–106)
Creatinine, Ser: 0.96 mg/dL (ref 0.76–1.27)
Globulin, Total: 2.6 g/dL (ref 1.5–4.5)
Sodium: 138 mmol/L (ref 134–144)

## 2019-01-29 LAB — LIPID PANEL WITH LDL/HDL RATIO
HDL: 43 mg/dL (ref >39)
LDL Chol Calc (NIH): 140 mg/dL — ABNORMAL HIGH (ref 0–99)
LDL/HDL Ratio: 3.3 ratio (ref 0.0–3.6)
Triglycerides: 207 mg/dL — ABNORMAL HIGH (ref 0–149)

## 2019-02-12 ENCOUNTER — Telehealth: Payer: Self-pay

## 2019-02-13 NOTE — Telephone Encounter (Signed)
He can hold off on his Thyroid medication for now, will recheck his levels in 3- 6 months.

## 2019-02-14 ENCOUNTER — Telehealth: Payer: Self-pay

## 2019-02-14 NOTE — Telephone Encounter (Signed)
Pt advised hold for thyroid med for now and we can recheck in 3 months

## 2019-02-20 DIAGNOSIS — M9903 Segmental and somatic dysfunction of lumbar region: Secondary | ICD-10-CM | POA: Diagnosis not present

## 2019-02-20 DIAGNOSIS — M5431 Sciatica, right side: Secondary | ICD-10-CM | POA: Diagnosis not present

## 2019-02-20 DIAGNOSIS — M9905 Segmental and somatic dysfunction of pelvic region: Secondary | ICD-10-CM | POA: Diagnosis not present

## 2019-02-20 DIAGNOSIS — M5136 Other intervertebral disc degeneration, lumbar region: Secondary | ICD-10-CM | POA: Diagnosis not present

## 2019-03-20 DIAGNOSIS — M5136 Other intervertebral disc degeneration, lumbar region: Secondary | ICD-10-CM | POA: Diagnosis not present

## 2019-03-20 DIAGNOSIS — M5431 Sciatica, right side: Secondary | ICD-10-CM | POA: Diagnosis not present

## 2019-03-20 DIAGNOSIS — M9905 Segmental and somatic dysfunction of pelvic region: Secondary | ICD-10-CM | POA: Diagnosis not present

## 2019-03-20 DIAGNOSIS — M9903 Segmental and somatic dysfunction of lumbar region: Secondary | ICD-10-CM | POA: Diagnosis not present

## 2019-04-17 DIAGNOSIS — M5136 Other intervertebral disc degeneration, lumbar region: Secondary | ICD-10-CM | POA: Diagnosis not present

## 2019-04-17 DIAGNOSIS — M9905 Segmental and somatic dysfunction of pelvic region: Secondary | ICD-10-CM | POA: Diagnosis not present

## 2019-04-17 DIAGNOSIS — M9903 Segmental and somatic dysfunction of lumbar region: Secondary | ICD-10-CM | POA: Diagnosis not present

## 2019-04-17 DIAGNOSIS — M5431 Sciatica, right side: Secondary | ICD-10-CM | POA: Diagnosis not present

## 2019-05-15 DIAGNOSIS — M5431 Sciatica, right side: Secondary | ICD-10-CM | POA: Diagnosis not present

## 2019-05-15 DIAGNOSIS — M9903 Segmental and somatic dysfunction of lumbar region: Secondary | ICD-10-CM | POA: Diagnosis not present

## 2019-05-15 DIAGNOSIS — M5136 Other intervertebral disc degeneration, lumbar region: Secondary | ICD-10-CM | POA: Diagnosis not present

## 2019-05-15 DIAGNOSIS — M9905 Segmental and somatic dysfunction of pelvic region: Secondary | ICD-10-CM | POA: Diagnosis not present

## 2019-06-12 DIAGNOSIS — M9905 Segmental and somatic dysfunction of pelvic region: Secondary | ICD-10-CM | POA: Diagnosis not present

## 2019-06-12 DIAGNOSIS — M9903 Segmental and somatic dysfunction of lumbar region: Secondary | ICD-10-CM | POA: Diagnosis not present

## 2019-06-12 DIAGNOSIS — M5431 Sciatica, right side: Secondary | ICD-10-CM | POA: Diagnosis not present

## 2019-06-12 DIAGNOSIS — M5136 Other intervertebral disc degeneration, lumbar region: Secondary | ICD-10-CM | POA: Diagnosis not present

## 2019-07-17 DIAGNOSIS — M9905 Segmental and somatic dysfunction of pelvic region: Secondary | ICD-10-CM | POA: Diagnosis not present

## 2019-07-17 DIAGNOSIS — M5136 Other intervertebral disc degeneration, lumbar region: Secondary | ICD-10-CM | POA: Diagnosis not present

## 2019-07-17 DIAGNOSIS — M9903 Segmental and somatic dysfunction of lumbar region: Secondary | ICD-10-CM | POA: Diagnosis not present

## 2019-07-17 DIAGNOSIS — M5431 Sciatica, right side: Secondary | ICD-10-CM | POA: Diagnosis not present

## 2019-08-28 DIAGNOSIS — M9903 Segmental and somatic dysfunction of lumbar region: Secondary | ICD-10-CM | POA: Diagnosis not present

## 2019-08-28 DIAGNOSIS — M9905 Segmental and somatic dysfunction of pelvic region: Secondary | ICD-10-CM | POA: Diagnosis not present

## 2019-08-28 DIAGNOSIS — M5136 Other intervertebral disc degeneration, lumbar region: Secondary | ICD-10-CM | POA: Diagnosis not present

## 2019-08-28 DIAGNOSIS — M5431 Sciatica, right side: Secondary | ICD-10-CM | POA: Diagnosis not present

## 2019-09-25 DIAGNOSIS — M5136 Other intervertebral disc degeneration, lumbar region: Secondary | ICD-10-CM | POA: Diagnosis not present

## 2019-09-25 DIAGNOSIS — M5431 Sciatica, right side: Secondary | ICD-10-CM | POA: Diagnosis not present

## 2019-09-25 DIAGNOSIS — M9905 Segmental and somatic dysfunction of pelvic region: Secondary | ICD-10-CM | POA: Diagnosis not present

## 2019-09-25 DIAGNOSIS — M9903 Segmental and somatic dysfunction of lumbar region: Secondary | ICD-10-CM | POA: Diagnosis not present

## 2019-11-06 DIAGNOSIS — M5431 Sciatica, right side: Secondary | ICD-10-CM | POA: Diagnosis not present

## 2019-11-06 DIAGNOSIS — M5136 Other intervertebral disc degeneration, lumbar region: Secondary | ICD-10-CM | POA: Diagnosis not present

## 2019-11-06 DIAGNOSIS — M9905 Segmental and somatic dysfunction of pelvic region: Secondary | ICD-10-CM | POA: Diagnosis not present

## 2019-11-06 DIAGNOSIS — M9903 Segmental and somatic dysfunction of lumbar region: Secondary | ICD-10-CM | POA: Diagnosis not present

## 2020-01-29 DIAGNOSIS — M5136 Other intervertebral disc degeneration, lumbar region: Secondary | ICD-10-CM | POA: Diagnosis not present

## 2020-01-29 DIAGNOSIS — M5431 Sciatica, right side: Secondary | ICD-10-CM | POA: Diagnosis not present

## 2020-01-29 DIAGNOSIS — M9905 Segmental and somatic dysfunction of pelvic region: Secondary | ICD-10-CM | POA: Diagnosis not present

## 2020-01-29 DIAGNOSIS — M9903 Segmental and somatic dysfunction of lumbar region: Secondary | ICD-10-CM | POA: Diagnosis not present

## 2020-02-17 IMAGING — US US THYROID
1 series · 14 of 25 positions shown · non-contrast
Comparison: None.

CLINICAL DATA: Hypothyroidism

EXAM:
THYROID ULTRASOUND
TECHNIQUE: Ultrasound examination of the thyroid gland and adjacent soft
tissues was performed.

[Series 1: us thyroid · 0.07mm/px · 14 of 39 slices shown]
[im 1/39]
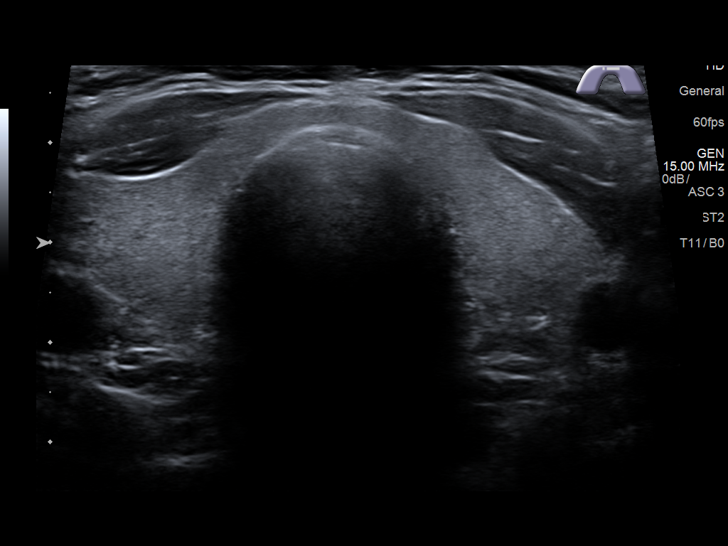
[im 4/39]
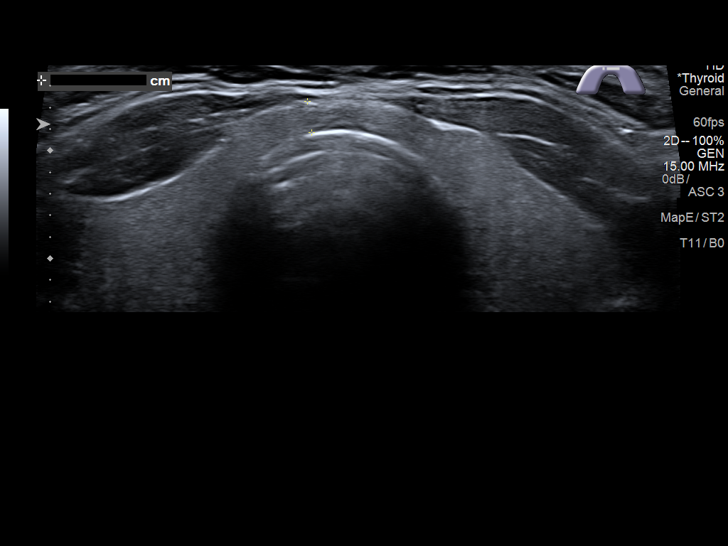
[im 7/39]
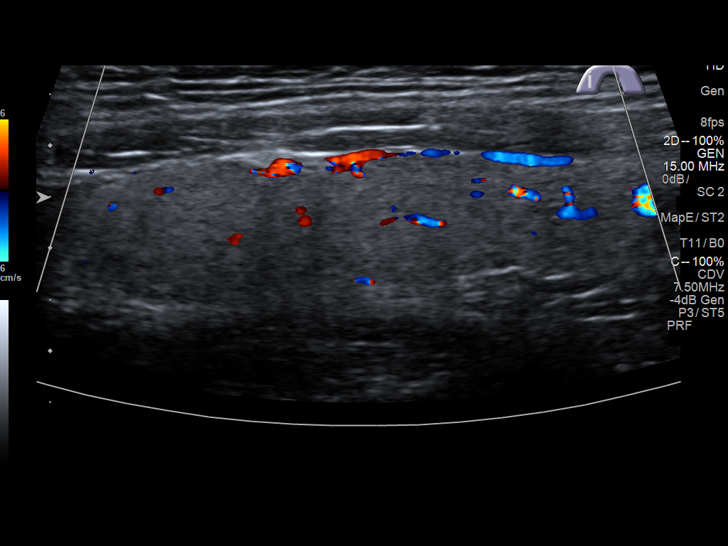
[im 10/39]
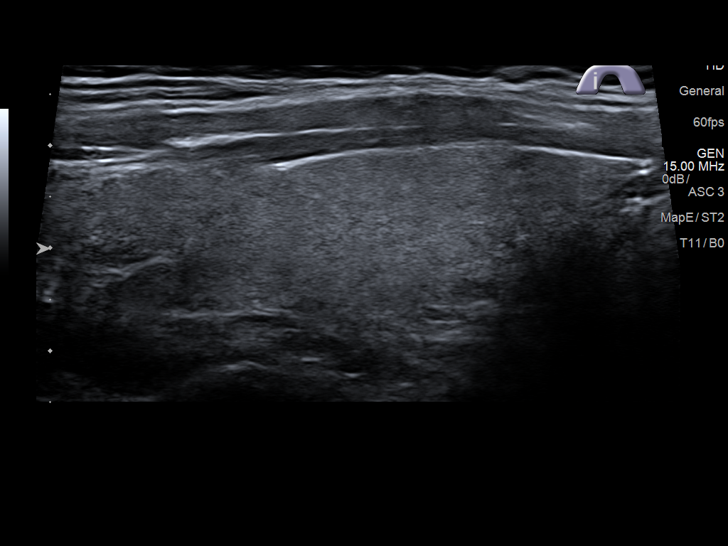
[im 13/39]
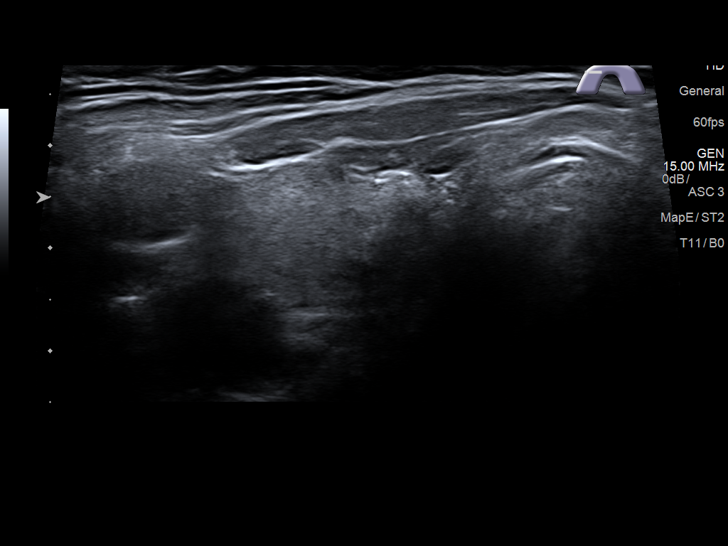
[im 15/39]
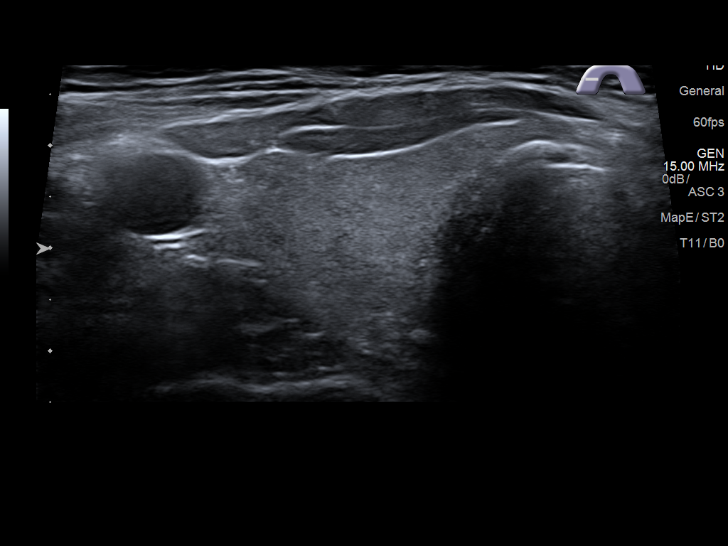
[im 18/39]
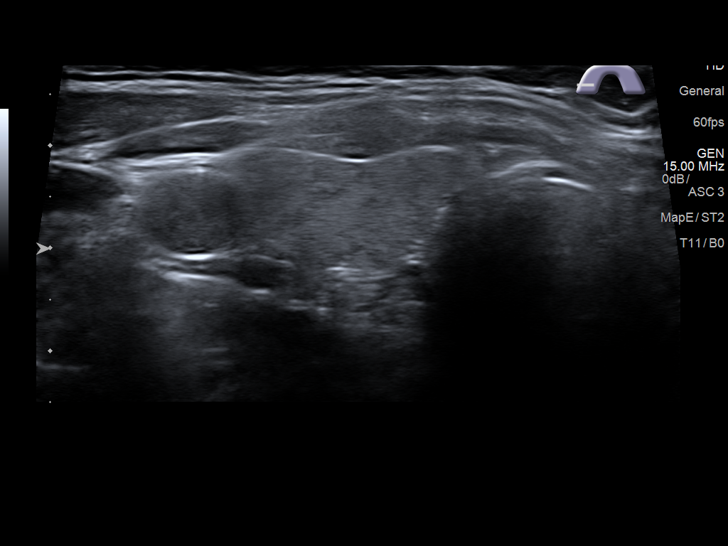
[im 21/39]
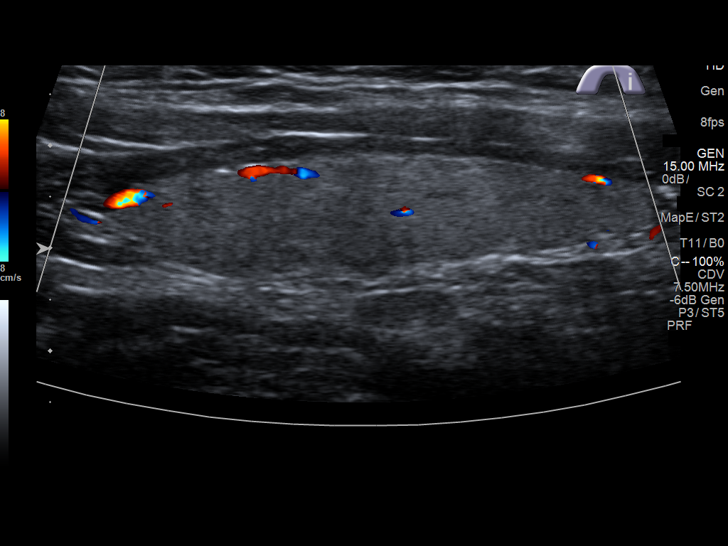
[im 24/39]
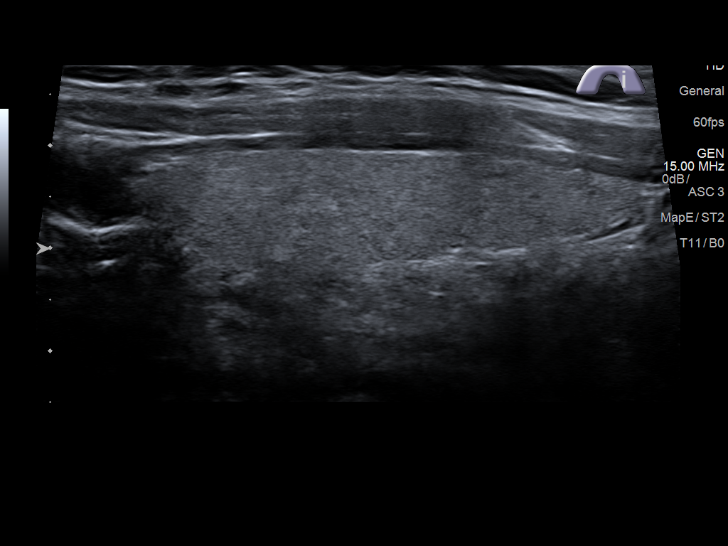
[im 26/39]
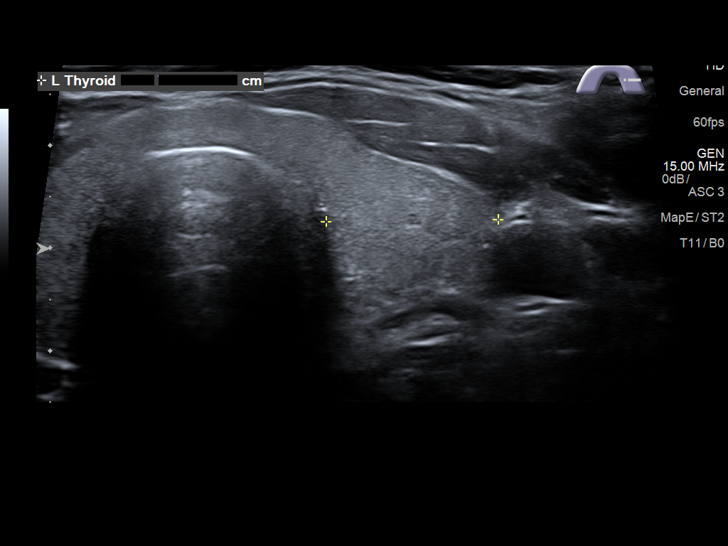
[im 29/39]
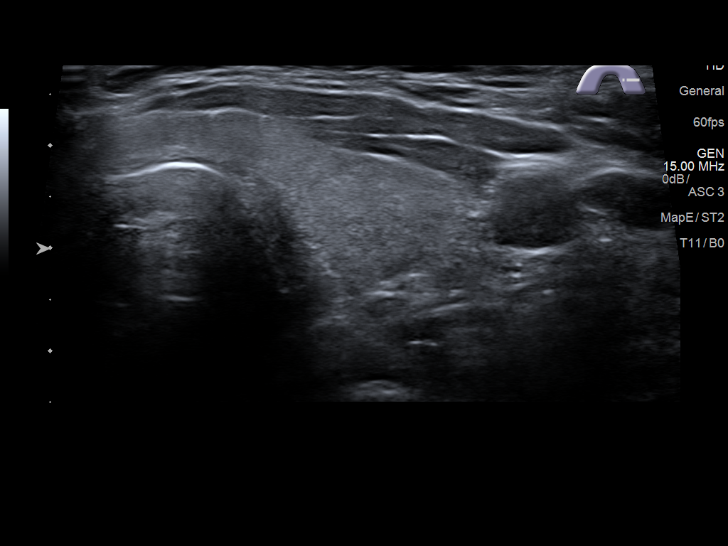
[im 32/39]
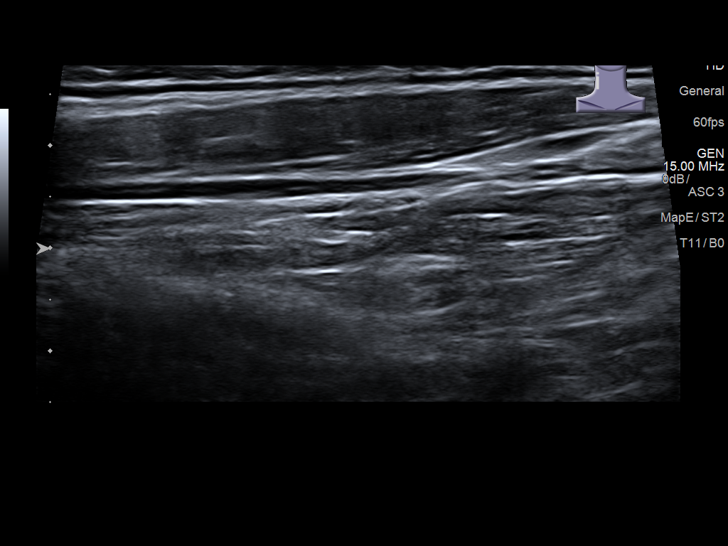
[im 35/39]
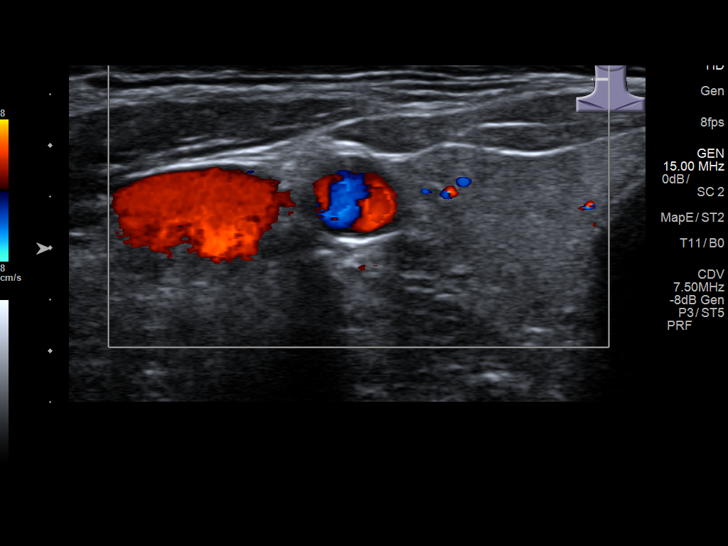
[im 39/39]
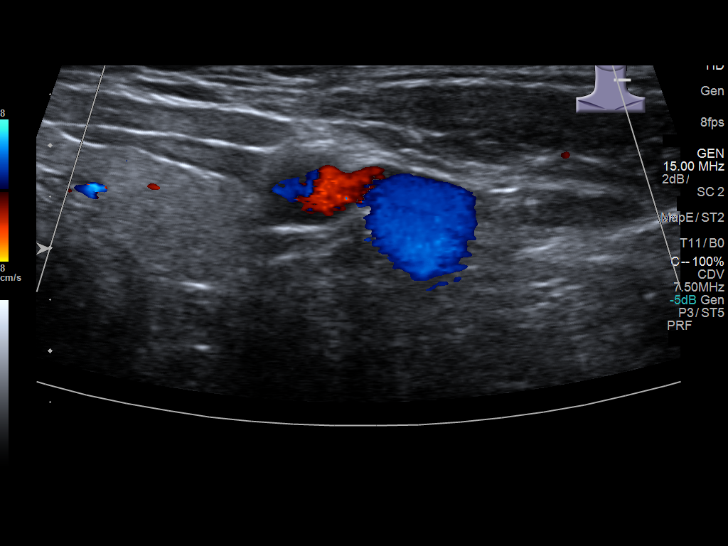

[14 of 25 positions shown; findings below may reference images not displayed]

FINDINGS: Parenchymal Echotexture: Normal

Isthmus: 3 mm

Right lobe: 6.0 x 1.5 x 2.3 cm

Left lobe: 5.5 x 1.2 x 1.7 cm

_________________________________________________________

Estimated total number of nodules >/= 1 cm: 0

Number of spongiform nodules >/=  2 cm not described below (TR1): 0

Number of mixed cystic and solid nodules >/= 1.5 cm not described
below (TR2): 0

_________________________________________________________

No discrete nodules are seen within the thyroid gland.
IMPRESSION: Normal thyroid ultrasound

The above is in keeping with the ACR TI-RADS recommendations - [HOSPITAL] 6345;[DATE].

## 2020-02-26 DIAGNOSIS — M9905 Segmental and somatic dysfunction of pelvic region: Secondary | ICD-10-CM | POA: Diagnosis not present

## 2020-02-26 DIAGNOSIS — M9903 Segmental and somatic dysfunction of lumbar region: Secondary | ICD-10-CM | POA: Diagnosis not present

## 2020-02-26 DIAGNOSIS — M5431 Sciatica, right side: Secondary | ICD-10-CM | POA: Diagnosis not present

## 2020-02-26 DIAGNOSIS — M5136 Other intervertebral disc degeneration, lumbar region: Secondary | ICD-10-CM | POA: Diagnosis not present

## 2020-04-08 DIAGNOSIS — M9903 Segmental and somatic dysfunction of lumbar region: Secondary | ICD-10-CM | POA: Diagnosis not present

## 2020-04-08 DIAGNOSIS — M5431 Sciatica, right side: Secondary | ICD-10-CM | POA: Diagnosis not present

## 2020-04-08 DIAGNOSIS — M5136 Other intervertebral disc degeneration, lumbar region: Secondary | ICD-10-CM | POA: Diagnosis not present

## 2020-04-08 DIAGNOSIS — M9905 Segmental and somatic dysfunction of pelvic region: Secondary | ICD-10-CM | POA: Diagnosis not present

## 2020-05-06 DIAGNOSIS — M9903 Segmental and somatic dysfunction of lumbar region: Secondary | ICD-10-CM | POA: Diagnosis not present

## 2020-05-06 DIAGNOSIS — M9905 Segmental and somatic dysfunction of pelvic region: Secondary | ICD-10-CM | POA: Diagnosis not present

## 2020-05-06 DIAGNOSIS — M5136 Other intervertebral disc degeneration, lumbar region: Secondary | ICD-10-CM | POA: Diagnosis not present

## 2020-05-06 DIAGNOSIS — M5431 Sciatica, right side: Secondary | ICD-10-CM | POA: Diagnosis not present

## 2020-07-01 DIAGNOSIS — M1712 Unilateral primary osteoarthritis, left knee: Secondary | ICD-10-CM | POA: Diagnosis not present

## 2020-07-01 DIAGNOSIS — M1711 Unilateral primary osteoarthritis, right knee: Secondary | ICD-10-CM | POA: Diagnosis not present

## 2020-07-01 DIAGNOSIS — M170 Bilateral primary osteoarthritis of knee: Secondary | ICD-10-CM | POA: Diagnosis not present

## 2020-07-01 DIAGNOSIS — M11262 Other chondrocalcinosis, left knee: Secondary | ICD-10-CM | POA: Diagnosis not present

## 2020-08-25 ENCOUNTER — Telehealth: Payer: Self-pay

## 2020-08-25 NOTE — Telephone Encounter (Signed)
Spoke with pt he said we still his pcp he will call us back and make appt

## 2020-08-26 DIAGNOSIS — M9903 Segmental and somatic dysfunction of lumbar region: Secondary | ICD-10-CM | POA: Diagnosis not present

## 2020-08-26 DIAGNOSIS — M9905 Segmental and somatic dysfunction of pelvic region: Secondary | ICD-10-CM | POA: Diagnosis not present

## 2020-08-26 DIAGNOSIS — M5136 Other intervertebral disc degeneration, lumbar region: Secondary | ICD-10-CM | POA: Diagnosis not present

## 2020-08-26 DIAGNOSIS — M5431 Sciatica, right side: Secondary | ICD-10-CM | POA: Diagnosis not present

## 2020-09-23 DIAGNOSIS — M5431 Sciatica, right side: Secondary | ICD-10-CM | POA: Diagnosis not present

## 2020-09-23 DIAGNOSIS — M9905 Segmental and somatic dysfunction of pelvic region: Secondary | ICD-10-CM | POA: Diagnosis not present

## 2020-09-23 DIAGNOSIS — M9903 Segmental and somatic dysfunction of lumbar region: Secondary | ICD-10-CM | POA: Diagnosis not present

## 2020-09-23 DIAGNOSIS — M5136 Other intervertebral disc degeneration, lumbar region: Secondary | ICD-10-CM | POA: Diagnosis not present

## 2020-11-04 DIAGNOSIS — M5136 Other intervertebral disc degeneration, lumbar region: Secondary | ICD-10-CM | POA: Diagnosis not present

## 2020-11-04 DIAGNOSIS — M5431 Sciatica, right side: Secondary | ICD-10-CM | POA: Diagnosis not present

## 2020-11-04 DIAGNOSIS — M9905 Segmental and somatic dysfunction of pelvic region: Secondary | ICD-10-CM | POA: Diagnosis not present

## 2020-11-04 DIAGNOSIS — M9903 Segmental and somatic dysfunction of lumbar region: Secondary | ICD-10-CM | POA: Diagnosis not present

## 2020-12-09 DIAGNOSIS — M5136 Other intervertebral disc degeneration, lumbar region: Secondary | ICD-10-CM | POA: Diagnosis not present

## 2020-12-09 DIAGNOSIS — M5431 Sciatica, right side: Secondary | ICD-10-CM | POA: Diagnosis not present

## 2020-12-09 DIAGNOSIS — M9903 Segmental and somatic dysfunction of lumbar region: Secondary | ICD-10-CM | POA: Diagnosis not present

## 2020-12-09 DIAGNOSIS — M9905 Segmental and somatic dysfunction of pelvic region: Secondary | ICD-10-CM | POA: Diagnosis not present

## 2020-12-16 DIAGNOSIS — M170 Bilateral primary osteoarthritis of knee: Secondary | ICD-10-CM | POA: Diagnosis not present

## 2021-01-20 DIAGNOSIS — M5136 Other intervertebral disc degeneration, lumbar region: Secondary | ICD-10-CM | POA: Diagnosis not present

## 2021-01-20 DIAGNOSIS — M9905 Segmental and somatic dysfunction of pelvic region: Secondary | ICD-10-CM | POA: Diagnosis not present

## 2021-01-20 DIAGNOSIS — M5431 Sciatica, right side: Secondary | ICD-10-CM | POA: Diagnosis not present

## 2021-01-20 DIAGNOSIS — M9903 Segmental and somatic dysfunction of lumbar region: Secondary | ICD-10-CM | POA: Diagnosis not present

## 2022-07-07 LAB — COLOGUARD: COLOGUARD: POSITIVE — AB

## 2023-10-13 ENCOUNTER — Ambulatory Visit (INDEPENDENT_AMBULATORY_CARE_PROVIDER_SITE_OTHER): Admitting: Urology

## 2023-10-13 VITALS — BP 132/87 | HR 66 | Ht 75.0 in | Wt 237.0 lb

## 2023-10-13 DIAGNOSIS — R972 Elevated prostate specific antigen [PSA]: Secondary | ICD-10-CM | POA: Diagnosis not present

## 2023-10-13 DIAGNOSIS — Z125 Encounter for screening for malignant neoplasm of prostate: Secondary | ICD-10-CM | POA: Diagnosis not present

## 2023-10-13 DIAGNOSIS — N529 Male erectile dysfunction, unspecified: Secondary | ICD-10-CM

## 2023-10-13 MED ORDER — SILDENAFIL CITRATE 100 MG PO TABS: 100.0000 mg | ORAL_TABLET | Freq: Every day | ORAL | 11 refills | Status: DC | PRN

## 2023-10-13 NOTE — Progress Notes (Signed)
 10/13/23 11:07 AM   Howard Hahn [birth year] [id number]  CC: Hypogonadism, ED, elevated PSA  HPI: 58 year old male here for the above issues.  He has been followed by [facility] and has been on testosterone  replacement for over a year, but has decided to come off that has had no significant change in his symptoms or ED.  Primary issue is ED, has been on Cialis daily with boost dose with no improvement.  Erections can come and go and unpredictable.  Does get some morning erections.  Also history of elevated PSA of 4.5 and 5.5 when he was on high-dose testosterone  and levels were over thousand.  He is interested in repeating the PSA after coming off testosterone  prior to considering more aggressive evaluation.  He does have a family history of prostate cancer in his father.  I previously took care of his wife Ellouise with chronic ureteral stent changes before she passed from ovarian cancer.   PMH: Past Medical History:  Diagnosis Date   Thyroid  disease     Surgical History: Past Surgical History:  Procedure Laterality Date   ACHILLES TENDON REPAIR     APPENDECTOMY     KNEE SURGERY       Family History: Family History  Problem Relation Age of Onset   Lung cancer Mother    Prostate cancer Father     Social History:  reports that he has never smoked. He has never used smokeless tobacco. He reports that he does not drink alcohol and does not use drugs.  Physical Exam: BP 132/87 (BP Location: Left Arm, Patient Position: Sitting, Cuff Size: Normal)   Pulse 66   Ht 6' 3 (1.905 m)   Wt 237 lb (107.5 kg)   SpO2 97%   BMI 29.62 kg/m    Constitutional:  Alert and oriented, No acute distress. Cardiovascular: No clubbing, cyanosis, or edema. Respiratory: Normal respiratory effort, no increased work of breathing. GI: Abdomen is soft, nontender, nondistended, no abdominal masses   Laboratory Data: Reviewed, see media tab  Assessment & Plan:   58 year old male with  hypogonadism, ED, elevated PSA on testosterone .  No improvement previously on exogenous testosterone  and he is in the process of weaning himself off that medication.  ED refractory to Cialis, and he was interested in a trial of Viagra  risks and benefits were discussed.  We discussed other options like penile injections or penile prosthesis.  We reviewed the implications of an elevated PSA and the uncertainty surrounding it. In general, a man's PSA increases with age and is produced by both normal and cancerous prostate tissue. The differential diagnosis for elevated PSA includes BPH, prostate cancer, infection/prostatitis, recent intercourse/ejaculation, recent urethroscopic manipulation (foley placement/cystoscopy) or trauma. Management of an elevated PSA can include observation/surveillance, prostate MRI, or prostate biopsy and we discussed this in detail. Our goal is to detect clinically significant prostate cancers, and manage with either active surveillance, surgery, or radiation for localized disease. Risks of prostate biopsy include bleeding, infection (including life threatening sepsis), pain, and lower urinary symptoms. Hematuria, hematospermia, and blood in the stool are all common after biopsy and can persist up to 4 weeks.  -Using shared decision making, he opted for follow-up in 2 months with a PHI score and testosterone  prior to considering more testing with prostate MRI.  He understands need for MRI and potential biopsy if PSA remains elevated. - Trial of sildenafil  100 mg on demand for ED   Redell Burnet, MD 10/13/2023  [facility]  [address] [place], [place] [address] [phone]

## 2023-10-13 NOTE — Patient Instructions (Signed)
 Understanding Erectile Dysfunction (ED)  What is ED? Erectile Dysfunction, or ED, is when a man has trouble getting or keeping an erection enough for sex. It is common and can happen at any age but is more common as men get older.  What Causes ED? ED can happen for many reasons, including:    Stress or feeling anxious Health problems like diabetes, heart disease, or high blood pressure Lifestyle habits like smoking, drinking too much alcohol, drug use, or not enough exercise Certain medicines(some blood pressure medications, antidepressants, sedatives)  Symptoms of ED:   Difficulty getting an erection Trouble keeping an erection during sex Reduced interest in sex  Treatment Options There are different ways to treat ED. Talk to your doctor to find what's best for you.  Lifestyle Changes   Exercise regularly Eat healthy foods Quit smoking and limit alcohol Weight loss Reduce stress  Medications   Oral medicines like Viagra (sildenafil ) or Cialis(tadalafil).  These are generic and affordable, the lowest price is at [email or web address].  These must be taken about an hour before sexual activity, and still require sexual stimulation to get an erection Side effects can include stuffy nose, headache, muscle pain, or changes in vision There is no risk of heart attack or stroke when medications are taken as directed These medications cannot be taken with nitrates for chest pain  Other Treatments    Penile injections-a medicine is injected directly into the penis to give you an immediate erection Devices like pump vacuum systems that help create an erection Surgery: Penile prosthesis for patients with no improvement with medicines or injections who are still interested in sexual activity.  Visit [email or web address] for more details.  Dr. Lovie is a urologist in [facility] with [facility] who performs penile prosthesis surgeries Counseling or Therapy    If ED is caused by  stress, anxiety, or relationship issues, talking to a counselor or therapist can help. ---------------------------------------------------------   Understanding PSA Screening  What is PSA Screening? PSA stands for Prostate-Specific Antigen. It is a protein made by the prostate gland.  PSA is made by normal prostate tissue, but can be elevated in patients with prostate cancer.  There are other factors that can cause an increased PSA besides prostate cancer including an enlarged prostate(BPH), recent ejaculation, infection(prostatitis), inflammation, recent illness or procedure.  A normal PSA level is less than 4 for men under age 58.  Why is PSA Screening Important? Prostate cancer is a common cancer in men, by finding prostate cancer early before patients have symptoms we can often cure this with either surgery or radiation.  Some prostate cancers grow very slowly and do not need any intervention and can be safely monitored(active surveillance).  Our goal is to find those more aggressive prostate cancers that if untreated would spread outside the prostate and cause symptoms or death.  If prostate cancer spreads outside the prostate, it cannot usually be cured, but multiple treatments are available to slow the growth of cancer and prolong life.  Who Should Get Tested? Most patients should start PSA testing around age 74 or 17, however high risk patients with a family history of prostate cancer, African American descent, patients with a strong family history of breast cancer should consider screening earlier starting at age 49. After age 86, the risks of screening start outweigh the benefits, and routine screening is not recommended after age 61.  This is because even if you develop prostate cancer in your 36s, 8s, or 90s it tends to grow  so slowly that it would not cause symptoms or problems.  What Are the Benefits?    Early detection of prostate cancer More treatment options if cancer is  found  Options if you have an elevated PSA: First, a confirmatory second PSA level will always be checked to confirm elevation, as false elevations are common and we want to avoid any invasive testing if possible If you have 2 elevated PSA levels, options include:  PHI(prostate health index) score: (fancy PSA blood test), this can help determine your risk of prostate cancer if your PSA is mildly elevated between 4 and 10 before moving to more expensive/invasive testing Prostate MRI: Imaging test that can detect areas suspicious for prostate cancer, if MRI is completely normal can potentially avoid a prostate biopsy Prostate biopsy: 10 to 15-minute procedure performed in clinic, can be uncomfortable, 1 to 2% chance of serious bleeding or infection, best test to determine if prostate cancer is present but more invasive   What Are the Risks or Downsides?    Prostate biopsy can be uncomfortable with a small risk of bleeding or infection Some prostate cancers grow very slowly and might never cause problems, and detection may lead to unnecessary treatments Possible side effects from follow-up tests or treatments

## 2023-12-06 ENCOUNTER — Other Ambulatory Visit: Admission: RE | Admit: 2023-12-06 | Source: Home / Self Care

## 2023-12-08 LAB — REFLEX INFORMATION

## 2023-12-08 LAB — TESTOSTERONE: Testosterone: 408 ng/dL (ref 264–916)

## 2023-12-08 LAB — PROSTATE HEALTH INDEX: Prostate Specific Ag: 1.6 ng/mL (ref 0.0–3.9)

## 2023-12-14 ENCOUNTER — Ambulatory Visit: Admitting: Urology

## 2023-12-14 VITALS — BP 130/82 | HR 78

## 2023-12-14 DIAGNOSIS — N529 Male erectile dysfunction, unspecified: Secondary | ICD-10-CM

## 2023-12-14 DIAGNOSIS — R972 Elevated prostate specific antigen [PSA]: Secondary | ICD-10-CM

## 2023-12-14 MED ORDER — SILDENAFIL CITRATE 100 MG PO TABS: 100.0000 mg | ORAL_TABLET | Freq: Every day | ORAL | 11 refills | Status: AC | PRN

## 2023-12-14 NOTE — Progress Notes (Signed)
   12/14/2023 8:55 AM   Howard Hahn 01/09/1966 [id number]  Reason for visit: Follow up elevated PSA, ED  History: Original visit with [facility] August 2025, previously had been followed by a men's health clinic and on testosterone  replacement Saw no improvement in symptoms when he was on testosterone  injections, also had PSA elevations of 4.5 and 5.5 when testosterone  levels were over 1000 Family history of prostate cancer in his father ED not responsive to Cialis I previously took care of his wife Ellouise with chronic ureteral stent changes before she passed from ovarian cancer  Physical Exam: BP 130/82 (BP Location: Left Arm, Patient Position: Sitting, Cuff Size: Large)   Pulse 78   SpO2 99%   Imaging/labs: October 2025: PSA 1.6, testosterone  408  Today: PSA has returned to normal since coming off exogenous testosterone  Testosterone  levels have also normalized at 408, he denies any hypogonadism symptoms today Using sildenafil  100 mg as needed with excellent results  Plan:   PSA screening: Family history of prostate cancer, PSA normalized after coming off high-dose testosterone , he will continue PSA screening yearly with his PCP/work physical ED: Continue sildenafil  100 mg PRN RTC 1 year   Howard JAYSON Burnet, MD  [facility] [address] [place], [place] [address] [phone]

## 2023-12-14 NOTE — Patient Instructions (Signed)
 Understanding Erectile Dysfunction (ED)  What is ED? Erectile Dysfunction, or ED, is when a man has trouble getting or keeping an erection enough for sex. It is common and can happen at any age but is more common as men get older.  What Causes ED? ED can happen for many reasons, including:    Stress or feeling anxious Health problems like diabetes, heart disease, or high blood pressure Lifestyle habits like smoking, drinking too much alcohol, drug use, or not enough exercise Certain medicines(some blood pressure medications, antidepressants, sedatives)  Symptoms of ED:   Difficulty getting an erection Trouble keeping an erection during sex Reduced interest in sex  Treatment Options There are different ways to treat ED. Talk to your doctor to find what's best for you.  Lifestyle Changes   Exercise regularly Eat healthy foods Quit smoking and limit alcohol Weight loss Reduce stress  Medications   Oral medicines like Viagra(sildenafil) or Cialis (tadalafil ).  These are generic and affordable, the lowest price is at [email or web address].  These must be taken about an hour before sexual activity, and still require sexual stimulation to get an erection Side effects can include stuffy nose, headache, muscle pain, or changes in vision There is no risk of heart attack or stroke when medications are taken as directed These medications cannot be taken with nitrates for chest pain  Other Treatments    Penile injections-a medicine is injected directly into the penis to give you an immediate erection Devices like pump vacuum systems that help create an erection Surgery: Penile prosthesis for patients with no improvement with medicines or injections who are still interested in sexual activity.  Visit [email or web address] for more details.  Dr. Lovie is a urologist in [facility] with [facility] who performs penile prosthesis surgeries Counseling or Therapy    If ED is caused by  stress, anxiety, or relationship issues, talking to a counselor or therapist can help.

## 2024-12-17 ENCOUNTER — Ambulatory Visit: Admitting: Urology
